# Patient Record
Sex: Female | Born: 1955 | Race: White | Hispanic: No | Marital: Married | Smoking: Current every day smoker
Health system: Southern US, Community
[De-identification: ages and names within clinical notes are randomized; demographics above are authoritative.]

## PROBLEM LIST (undated history)

## (undated) DIAGNOSIS — C240 Malignant neoplasm of extrahepatic bile duct: Secondary | ICD-10-CM

## (undated) HISTORY — PX: OTHER SURGICAL HISTORY: SHX169

---

## 1997-06-07 ENCOUNTER — Encounter: Admission: RE | Admit: 1997-06-07 | Discharge: 1997-09-05 | Payer: Self-pay | Admitting: Radiation Oncology

## 1997-08-03 ENCOUNTER — Other Ambulatory Visit: Admission: RE | Admit: 1997-08-03 | Discharge: 1997-08-03 | Payer: Self-pay | Admitting: Oncology

## 1997-10-13 ENCOUNTER — Emergency Department (HOSPITAL_COMMUNITY): Admission: EM | Admit: 1997-10-13 | Discharge: 1997-10-13 | Payer: Self-pay | Admitting: Emergency Medicine

## 1998-12-04 ENCOUNTER — Other Ambulatory Visit: Admission: RE | Admit: 1998-12-04 | Discharge: 1998-12-04 | Payer: Self-pay | Admitting: Obstetrics and Gynecology

## 1998-12-05 ENCOUNTER — Encounter (INDEPENDENT_AMBULATORY_CARE_PROVIDER_SITE_OTHER): Payer: Self-pay

## 1998-12-05 ENCOUNTER — Other Ambulatory Visit: Admission: RE | Admit: 1998-12-05 | Discharge: 1998-12-05 | Payer: Self-pay | Admitting: Obstetrics and Gynecology

## 1998-12-06 ENCOUNTER — Emergency Department (HOSPITAL_COMMUNITY): Admission: EM | Admit: 1998-12-06 | Discharge: 1998-12-06 | Payer: Self-pay | Admitting: Emergency Medicine

## 1999-02-24 ENCOUNTER — Inpatient Hospital Stay (HOSPITAL_COMMUNITY): Admission: RE | Admit: 1999-02-24 | Discharge: 1999-02-26 | Payer: Self-pay | Admitting: Obstetrics and Gynecology

## 1999-02-24 ENCOUNTER — Encounter (INDEPENDENT_AMBULATORY_CARE_PROVIDER_SITE_OTHER): Payer: Self-pay | Admitting: Specialist

## 1999-04-24 ENCOUNTER — Ambulatory Visit (HOSPITAL_BASED_OUTPATIENT_CLINIC_OR_DEPARTMENT_OTHER): Admission: RE | Admit: 1999-04-24 | Discharge: 1999-04-24 | Payer: Self-pay | Admitting: General Surgery

## 1999-05-27 ENCOUNTER — Encounter: Admission: RE | Admit: 1999-05-27 | Discharge: 1999-05-27 | Payer: Self-pay | Admitting: Hematology & Oncology

## 1999-05-27 ENCOUNTER — Encounter: Payer: Self-pay | Admitting: Hematology & Oncology

## 2000-01-05 ENCOUNTER — Encounter: Admission: RE | Admit: 2000-01-05 | Discharge: 2000-01-05 | Payer: Self-pay | Admitting: Hematology & Oncology

## 2000-01-05 ENCOUNTER — Encounter: Payer: Self-pay | Admitting: Hematology & Oncology

## 2000-02-05 ENCOUNTER — Encounter: Payer: Self-pay | Admitting: Emergency Medicine

## 2000-02-05 ENCOUNTER — Emergency Department (HOSPITAL_COMMUNITY): Admission: EM | Admit: 2000-02-05 | Discharge: 2000-02-05 | Payer: Self-pay | Admitting: Emergency Medicine

## 2001-01-11 ENCOUNTER — Encounter: Admission: RE | Admit: 2001-01-11 | Discharge: 2001-01-11 | Payer: Self-pay | Admitting: Internal Medicine

## 2002-07-31 ENCOUNTER — Encounter: Admission: RE | Admit: 2002-07-31 | Discharge: 2002-07-31 | Payer: Self-pay | Admitting: Internal Medicine

## 2002-08-09 ENCOUNTER — Encounter: Admission: RE | Admit: 2002-08-09 | Discharge: 2002-08-09 | Payer: Self-pay | Admitting: Internal Medicine

## 2002-08-09 ENCOUNTER — Encounter: Payer: Self-pay | Admitting: Internal Medicine

## 2004-07-14 ENCOUNTER — Ambulatory Visit: Payer: Self-pay | Admitting: Internal Medicine

## 2005-04-16 ENCOUNTER — Ambulatory Visit: Payer: Self-pay | Admitting: Internal Medicine

## 2006-05-31 ENCOUNTER — Encounter (INDEPENDENT_AMBULATORY_CARE_PROVIDER_SITE_OTHER): Payer: Self-pay | Admitting: Internal Medicine

## 2006-05-31 ENCOUNTER — Ambulatory Visit: Payer: Self-pay | Admitting: Internal Medicine

## 2006-05-31 DIAGNOSIS — Z853 Personal history of malignant neoplasm of breast: Secondary | ICD-10-CM

## 2006-05-31 DIAGNOSIS — F17200 Nicotine dependence, unspecified, uncomplicated: Secondary | ICD-10-CM

## 2006-05-31 DIAGNOSIS — Z9079 Acquired absence of other genital organ(s): Secondary | ICD-10-CM | POA: Insufficient documentation

## 2006-05-31 DIAGNOSIS — R569 Unspecified convulsions: Secondary | ICD-10-CM

## 2006-05-31 DIAGNOSIS — N951 Menopausal and female climacteric states: Secondary | ICD-10-CM

## 2009-09-13 ENCOUNTER — Emergency Department (HOSPITAL_COMMUNITY): Admission: EM | Admit: 2009-09-13 | Discharge: 2009-09-14 | Payer: Self-pay | Admitting: Emergency Medicine

## 2010-04-08 ENCOUNTER — Other Ambulatory Visit: Payer: Self-pay | Admitting: Family Medicine

## 2010-04-08 DIAGNOSIS — Z1239 Encounter for other screening for malignant neoplasm of breast: Secondary | ICD-10-CM

## 2010-04-14 ENCOUNTER — Ambulatory Visit
Admission: RE | Admit: 2010-04-14 | Discharge: 2010-04-14 | Disposition: A | Discharge status: Home / Self Care | Payer: Self-pay | Source: Ambulatory Visit | Attending: Family Medicine | Admitting: Family Medicine

## 2010-04-14 DIAGNOSIS — Z1239 Encounter for other screening for malignant neoplasm of breast: Secondary | ICD-10-CM

## 2010-08-06 ENCOUNTER — Other Ambulatory Visit: Payer: Self-pay | Admitting: Family Medicine

## 2010-08-06 DIAGNOSIS — Z13820 Encounter for screening for osteoporosis: Secondary | ICD-10-CM

## 2010-08-14 ENCOUNTER — Ambulatory Visit
Admission: RE | Admit: 2010-08-14 | Discharge: 2010-08-14 | Disposition: A | Discharge status: Home / Self Care | Payer: BC Managed Care – PPO | Source: Ambulatory Visit | Attending: Family Medicine | Admitting: Family Medicine

## 2010-08-14 DIAGNOSIS — Z13820 Encounter for screening for osteoporosis: Secondary | ICD-10-CM

## 2011-12-04 ENCOUNTER — Other Ambulatory Visit: Payer: Self-pay | Admitting: Family Medicine

## 2011-12-04 DIAGNOSIS — Z1231 Encounter for screening mammogram for malignant neoplasm of breast: Secondary | ICD-10-CM

## 2011-12-24 ENCOUNTER — Ambulatory Visit: Payer: BC Managed Care – PPO

## 2012-01-01 ENCOUNTER — Ambulatory Visit
Admission: RE | Admit: 2012-01-01 | Discharge: 2012-01-01 | Disposition: A | Discharge status: Home / Self Care | Payer: Medicaid Other | Source: Ambulatory Visit | Attending: Family Medicine | Admitting: Family Medicine

## 2012-01-01 DIAGNOSIS — Z1231 Encounter for screening mammogram for malignant neoplasm of breast: Secondary | ICD-10-CM

## 2012-01-05 ENCOUNTER — Other Ambulatory Visit: Payer: Self-pay | Admitting: Family Medicine

## 2012-01-05 DIAGNOSIS — R928 Other abnormal and inconclusive findings on diagnostic imaging of breast: Secondary | ICD-10-CM

## 2012-01-11 ENCOUNTER — Ambulatory Visit
Admission: RE | Admit: 2012-01-11 | Discharge: 2012-01-11 | Disposition: A | Discharge status: Home / Self Care | Payer: Medicaid Other | Source: Ambulatory Visit | Attending: Family Medicine | Admitting: Family Medicine

## 2012-01-11 DIAGNOSIS — R928 Other abnormal and inconclusive findings on diagnostic imaging of breast: Secondary | ICD-10-CM

## 2014-10-29 ENCOUNTER — Emergency Department (HOSPITAL_COMMUNITY): Payer: Medicare Other

## 2014-10-29 ENCOUNTER — Emergency Department (HOSPITAL_COMMUNITY)
Admission: EM | Admit: 2014-10-29 | Discharge: 2014-10-30 | Disposition: A | Discharge status: Home / Self Care | Payer: Medicare Other | Attending: Emergency Medicine | Admitting: Emergency Medicine

## 2014-10-29 ENCOUNTER — Encounter (HOSPITAL_COMMUNITY): Payer: Self-pay | Admitting: Emergency Medicine

## 2014-10-29 DIAGNOSIS — Y9289 Other specified places as the place of occurrence of the external cause: Secondary | ICD-10-CM | POA: Insufficient documentation

## 2014-10-29 DIAGNOSIS — W108XXA Fall (on) (from) other stairs and steps, initial encounter: Secondary | ICD-10-CM | POA: Insufficient documentation

## 2014-10-29 DIAGNOSIS — Y9301 Activity, walking, marching and hiking: Secondary | ICD-10-CM | POA: Diagnosis not present

## 2014-10-29 DIAGNOSIS — S0093XA Contusion of unspecified part of head, initial encounter: Secondary | ICD-10-CM | POA: Diagnosis not present

## 2014-10-29 DIAGNOSIS — F10120 Alcohol abuse with intoxication, uncomplicated: Secondary | ICD-10-CM | POA: Insufficient documentation

## 2014-10-29 DIAGNOSIS — Z720 Tobacco use: Secondary | ICD-10-CM | POA: Insufficient documentation

## 2014-10-29 DIAGNOSIS — S0990XA Unspecified injury of head, initial encounter: Secondary | ICD-10-CM

## 2014-10-29 DIAGNOSIS — F10920 Alcohol use, unspecified with intoxication, uncomplicated: Secondary | ICD-10-CM

## 2014-10-29 DIAGNOSIS — Y998 Other external cause status: Secondary | ICD-10-CM | POA: Diagnosis not present

## 2014-10-29 HISTORY — DX: Malignant neoplasm of extrahepatic bile duct: C24.0

## 2014-10-29 LAB — CBC WITH DIFFERENTIAL/PLATELET
BASOS PCT: 0 % (ref 0–1)
Basophils Absolute: 0 10*3/uL (ref 0.0–0.1)
Eosinophils Absolute: 0.1 10*3/uL (ref 0.0–0.7)
Eosinophils Relative: 4 % (ref 0–5)
HEMATOCRIT: 35.2 % — AB (ref 36.0–46.0)
HEMOGLOBIN: 11.6 g/dL — AB (ref 12.0–15.0)
LYMPHS ABS: 1.1 10*3/uL (ref 0.7–4.0)
LYMPHS PCT: 43 % (ref 12–46)
MCH: 33.6 pg (ref 26.0–34.0)
MCHC: 33 g/dL (ref 30.0–36.0)
MCV: 102 fL — AB (ref 78.0–100.0)
MONO ABS: 0.2 10*3/uL (ref 0.1–1.0)
MONOS PCT: 8 % (ref 3–12)
NEUTROS ABS: 1.2 10*3/uL — AB (ref 1.7–7.7)
NEUTROS PCT: 45 % (ref 43–77)
Platelets: 99 10*3/uL — ABNORMAL LOW (ref 150–400)
RBC: 3.45 MIL/uL — ABNORMAL LOW (ref 3.87–5.11)
RDW: 13.4 % (ref 11.5–15.5)
WBC: 2.6 10*3/uL — ABNORMAL LOW (ref 4.0–10.5)

## 2014-10-29 LAB — COMPREHENSIVE METABOLIC PANEL
ALK PHOS: 108 U/L (ref 38–126)
ALT: 20 U/L (ref 14–54)
AST: 34 U/L (ref 15–41)
Albumin: 3.8 g/dL (ref 3.5–5.0)
Anion gap: 12 (ref 5–15)
BUN: 13 mg/dL (ref 6–20)
CALCIUM: 9.1 mg/dL (ref 8.9–10.3)
CHLORIDE: 107 mmol/L (ref 101–111)
CO2: 23 mmol/L (ref 22–32)
CREATININE: 0.97 mg/dL (ref 0.44–1.00)
GFR calc Af Amer: >60 mL/min (ref >60)
Glucose, Bld: 109 mg/dL — ABNORMAL HIGH (ref 65–99)
Potassium: 3.3 mmol/L — ABNORMAL LOW (ref 3.5–5.1)
Sodium: 142 mmol/L (ref 135–145)
Total Bilirubin: 0.4 mg/dL (ref 0.3–1.2)
Total Protein: 6.1 g/dL — ABNORMAL LOW (ref 6.5–8.1)

## 2014-10-29 LAB — AMMONIA: AMMONIA: 28 umol/L (ref 9–35)

## 2014-10-29 LAB — PROTIME-INR
INR: 2.6 — AB (ref 0.00–1.49)
Prothrombin Time: 27.5 seconds — ABNORMAL HIGH (ref 11.6–15.2)

## 2014-10-29 NOTE — ED Provider Notes (Signed)
CSN: [id number]     Arrival date & time 10/29/14  2105 History   First MD Initiated Contact with Patient 10/29/14 2106     Chief Complaint  Patient presents with  . Fall  . Alcohol Intoxication     (Consider location/radiation/quality/duration/timing/severity/associated sxs/prior Treatment) HPI Comments: Patient presents to the emergency department for evaluation after a fall. Patient admits to drinking alcohol tonight. Patient reportedly lost her balance walking up steps and fell backwards. She fell down several steps and landed on the concrete landing at the bottom of the steps. There was no loss of consciousness according to bystanders. Patient noted to have a hematoma on the back of her head by EMS, patient denies pain.     Patient is a 59 y.o. female presenting with fall and intoxication.  Fall Associated symptoms include headaches.  Alcohol Intoxication Associated symptoms include headaches.    Past Medical History  Diagnosis Date  . Bile duct cancer    Past Surgical History  Procedure Laterality Date  . Bile duct cancer     No family history on file. Social History  Substance Use Topics  . Smoking status: Current Every Day Smoker  . Smokeless tobacco: None  . Alcohol Use: Yes   OB History    No data available     Review of Systems  Neurological: Positive for headaches.  All other systems reviewed and are negative.     Allergies  Review of patient's allergies indicates not on file.  Home Medications   Prior to Admission medications   Not on File   SpO2 100% Physical Exam  Constitutional: She is oriented to person, place, and time. She appears well-developed and well-nourished. No distress.  HENT:  Head: Normocephalic. Head is with contusion.    Right Ear: Hearing normal.  Left Ear: Hearing normal.  Nose: Nose normal.  Mouth/Throat: Oropharynx is clear and moist and mucous membranes are normal.  Eyes: Conjunctivae and EOM are normal. Pupils are  equal, round, and reactive to light.  Neck: Normal range of motion. Neck supple.  Cardiovascular: Regular rhythm, S1 normal and S2 normal.  Exam reveals no gallop and no friction rub.   No murmur heard. Pulmonary/Chest: Effort normal and breath sounds normal. No respiratory distress. She exhibits no tenderness.  Abdominal: Soft. Normal appearance and bowel sounds are normal. There is no hepatosplenomegaly. There is no tenderness. There is no rebound, no guarding, no tenderness at McBurney's point and negative Murphy's sign. No hernia.  Musculoskeletal: Normal range of motion.  Neurological: She is alert and oriented to person, place, and time. She has normal strength. No cranial nerve deficit or sensory deficit. Coordination normal. GCS eye subscore is 4. GCS verbal subscore is 5. GCS motor subscore is 6.  Skin: Skin is warm, dry and intact. No rash noted. No cyanosis.  Psychiatric: Thought content normal. Her speech is slurred.  Nursing note and vitals reviewed.   ED Course  Procedures (including critical care time) Labs Review Labs Reviewed  CBC WITH DIFFERENTIAL/PLATELET  PROTIME-INR  COMPREHENSIVE METABOLIC PANEL  AMMONIA    Imaging Review No results found. I have personally reviewed and evaluated these images and lab results as part of my medical decision-making.   EKG Interpretation None      MDM   Final diagnoses:  None  head contusion Alcohol Intoxication  Patient presents to the emergency department after a fall. Patient was walking up a flight of steps and lost her balance, falling backwards. She hit the  back of her head on the concrete at the bottom of the steps. There was no loss of consciousness. Patient has been drinking alcohol. Is somewhat intoxicated at arrival, although she is awake, alert, oriented. He does report that she was asking repetitive questions initially, but I have not seen any of his repetitive questioning. I suspect it was the alcohol  intoxication, but minor head injury cannot be ruled out. CT scan did not show any acute injury. Patient has been monitored and continues to do well. She is appropriate for discharge, outpatient treatment.    Orpah Greek, MD 10/30/14 [phone]

## 2014-10-29 NOTE — ED Notes (Signed)
Per [facility]. Pt drinking ETOH, confused about what day it was and location. PERRLA. GCS 13, pt asking repetitive questions. Pt denies pain. Pt on C collar and LSB. Bystanders and patient denies LOC. Pt has hematoma on back of head. Pt is asking for water repeatedly with slurred speech. VSS

## 2014-10-29 NOTE — ED Notes (Signed)
Pt fell while walking up stairs, tripped and fell backwards at the bottom of the steps.

## 2014-10-29 NOTE — ED Notes (Signed)
Pt removed c-collar. States "i took it off, I can't breathe with that thing on."

## 2014-10-30 MED ORDER — HYDROCODONE-ACETAMINOPHEN 5-325 MG PO TABS: 2.0000 | ORAL_TABLET | ORAL | Status: AC | PRN

## 2014-10-30 NOTE — Discharge Instructions (Signed)
Alcohol Intoxication Alcohol intoxication occurs when you drink enough alcohol that it affects your ability to function. It can be mild or very severe. Drinking a lot of alcohol in a short time is called binge drinking. This can be very harmful. Drinking alcohol can also be more dangerous if you are taking medicines or other drugs. Some of the effects caused by alcohol may include:  Loss of coordination.  Changes in mood and behavior.  Unclear thinking.  Trouble talking (slurred speech).  Throwing up (vomiting).  Confusion.  Slowed breathing.  Twitching and shaking (seizures).  Loss of consciousness. HOME CARE  Do not drive after drinking alcohol.  Drink enough water and fluids to keep your pee (urine) clear or pale yellow. Avoid caffeine.  Only take medicine as told by your doctor. GET HELP IF:  You throw up (vomit) many times.  You do not feel better after a few days.  You frequently have alcohol intoxication. Your doctor can help decide if you should see a substance use treatment counselor. GET HELP RIGHT AWAY IF:  You become shaky when you stop drinking.  You have twitching and shaking.  You throw up blood. It may look bright red or like coffee grounds.  You notice blood in your poop (bowel movements).  You become lightheaded or pass out (faint). MAKE SURE YOU:   Understand these instructions.  Will watch your condition.  Will get help right away if you are not doing well or get worse. Document Released: 08/12/2007 Document Revised: 10/26/2012 Document Reviewed: 07/29/2012 [facility] Patient Information 2015 [place], [place]. This information is not intended to replace advice given to you by your health care provider. Make sure you discuss any questions you have with your health care provider.  Concussion A concussion is a brain injury. It is caused by:  A hit to the head.  A quick and sudden movement (jolt) of the head or neck. A concussion is usually  not life threatening. Even so, it can cause serious problems. If you had a concussion before, you may have concussion-like problems after a hit to your head. HOME CARE General Instructions  Follow your doctor's directions carefully.  Take medicines only as told by your doctor.  Only take medicines your doctor says are safe.  Do not drink alcohol until your doctor says it is okay. Alcohol and some drugs can slow down healing. They can also put you at risk for further injury.  If you are having trouble remembering things, write them down.  Try to do one thing at a time if you get distracted easily. For example, do not watch TV while making dinner.  Talk to your family members or close friends when making important decisions.  Follow up with your doctor as told.  Watch your symptoms. Tell others to do the same. Serious problems can sometimes happen after a concussion. Older adults are more likely to have these problems.  Tell your teachers, school nurse, school counselor, coach, Product/process development scientist, or work Freight forwarder about your concussion. Tell them about what you can or cannot do. They should watch to see if:  It gets even harder for you to pay attention or concentrate.  It gets even harder for you to remember things or learn new things.  You need more time than normal to finish things.  You become annoyed (irritable) more than before.  You are not able to deal with stress as well.  You have more problems than before.  Rest. Make sure you:  Get plenty of sleep at night.  Go to sleep early.  Go to bed at the same time every day. Try to wake up at the same time.  Rest during the day.  Take naps when you feel tired.  Limit activities where you have to think a lot or concentrate. These include:  Doing homework.  Doing work related to a job.  Watching TV.  Using the computer. Returning To Your Regular Activities Return to your normal activities slowly, not all at once.  You must give your body and brain enough time to heal.   Do not play sports or do other athletic activities until your doctor says it is okay.  Ask your doctor when you can drive, ride a bicycle, or work other vehicles or machines. Never do these things if you feel dizzy.  Ask your doctor about when you can return to work or school. Preventing Another Concussion It is very important to avoid another brain injury, especially before you have healed. In rare cases, another injury can lead to permanent brain damage, brain swelling, or death. The risk of this is greatest during the first 7-10 days after your injury. Avoid injuries by:   Wearing a seat belt when riding in a car.  Not drinking too much alcohol.  Avoiding activities that could lead to a second concussion (such as contact sports).  Wearing a helmet when doing activities like:  Biking.  Skiing.  Skateboarding.  Skating.  Making your home safer by:  Removing things from the floor or stairways that could make you trip.  Using grab bars in bathrooms and handrails by stairs.  Placing non-slip mats on floors and in bathtubs.  Improve lighting in dark areas. GET HELP IF:  It gets even harder for you to pay attention or concentrate.  It gets even harder for you to remember things or learn new things.  You need more time than normal to finish things.  You become annoyed (irritable) more than before.  You are not able to deal with stress as well.  You have more problems than before.  You have problems keeping your balance.  You are not able to react quickly when you should. Get help if you have any of these problems for more than 2 weeks:   Lasting (chronic) headaches.  Dizziness or trouble balancing.  Feeling sick to your stomach (nausea).  Seeing (vision) problems.  Being affected by noises or light more than normal.  Feeling sad, low, down in the dumps, blue, gloomy, or empty (depressed).  Mood  changes (mood swings).  Feeling of fear or nervousness about what may happen (anxiety).  Feeling annoyed.  Memory problems.  Problems concentrating or paying attention.  Sleep problems.  Feeling tired all the time. GET HELP RIGHT AWAY IF:   You have bad headaches or your headaches get worse.  You have weakness (even if it is in one hand, leg, or part of the face).  You have loss of feeling (numbness).  You feel off balance.  You keep throwing up (vomiting).  You feel tired.  One black center of your eye (pupil) is larger than the other.  You twitch or shake violently (convulse).  Your speech is not clear (slurred).  You are more confused, easily angered (agitated), or annoyed than before.  You have more trouble resting than before.  You are unable to recognize people or places.  You have neck pain.  It is difficult to wake you up.  You  have unusual behavior changes.  You pass out (lose consciousness). MAKE SURE YOU:   Understand these instructions.  Will watch your condition.  Will get help right away if you are not doing well or get worse. Document Released: 02/11/2009 Document Revised: 07/10/2013 Document Reviewed: 09/15/2012 [facility] Patient Information 2015 [place], [place]. This information is not intended to replace advice given to you by your health care provider. Make sure you discuss any questions you have with your health care provider.

## 2014-12-31 ENCOUNTER — Other Ambulatory Visit: Payer: Self-pay | Admitting: Nurse Practitioner

## 2014-12-31 DIAGNOSIS — Z1231 Encounter for screening mammogram for malignant neoplasm of breast: Secondary | ICD-10-CM

## 2015-01-02 ENCOUNTER — Other Ambulatory Visit: Payer: Self-pay

## 2015-01-02 DIAGNOSIS — Z1231 Encounter for screening mammogram for malignant neoplasm of breast: Secondary | ICD-10-CM

## 2015-02-06 ENCOUNTER — Ambulatory Visit: Payer: Medicare Other

## 2015-12-02 ENCOUNTER — Other Ambulatory Visit: Payer: Self-pay | Admitting: Nurse Practitioner

## 2015-12-02 DIAGNOSIS — M810 Age-related osteoporosis without current pathological fracture: Secondary | ICD-10-CM

## 2015-12-27 ENCOUNTER — Ambulatory Visit
Admission: RE | Admit: 2015-12-27 | Discharge: 2015-12-27 | Disposition: A | Discharge status: Home / Self Care | Payer: Medicare Other | Source: Ambulatory Visit | Attending: Nurse Practitioner | Admitting: Nurse Practitioner

## 2015-12-27 ENCOUNTER — Ambulatory Visit
Admission: RE | Admit: 2015-12-27 | Discharge: 2015-12-27 | Disposition: A | Discharge status: Home / Self Care | Payer: Medicare Other | Source: Ambulatory Visit

## 2015-12-27 DIAGNOSIS — M810 Age-related osteoporosis without current pathological fracture: Secondary | ICD-10-CM

## 2015-12-27 DIAGNOSIS — Z1231 Encounter for screening mammogram for malignant neoplasm of breast: Secondary | ICD-10-CM

## 2016-06-07 DEATH — deceased

## 2016-09-02 IMAGING — CT CT HEAD W/O CM
3 of 5 series · 16 of 47 positions shown, 19 images · non-contrast
Comparison: 10/29/14

CLINICAL DATA: Confusion. Alcohol consumption hematoma to back of
head.

EXAM:
CT HEAD WITHOUT CONTRAST
CT CERVICAL SPINE WITHOUT CONTRAST
TECHNIQUE: Multidetector CT imaging of the head and cervical spine was
performed following the standard protocol without intravenous
contrast. Multiplanar CT image reconstructions of the cervical spine
were also generated.

[Series 7: coronals · coronal · 0.30mm/px · 3 of 64 slices shown]
[im 22/64  brain]
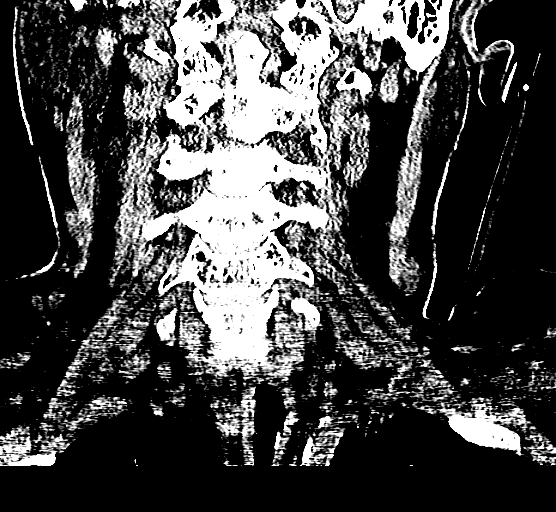
[im 29/64  brain]
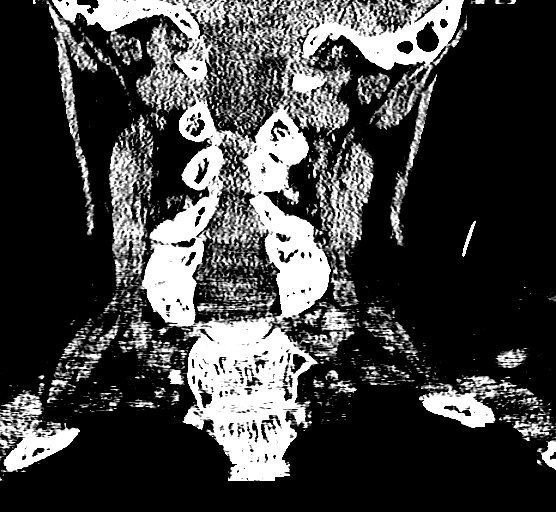
[im 36/64  brain]
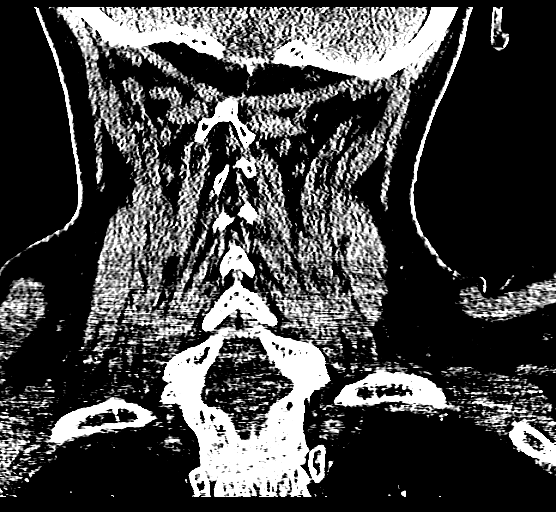

[Series 8: sagittals · sagittal · 0.30mm/px · 3 of 49 slices shown]
[im 17/49  brain]
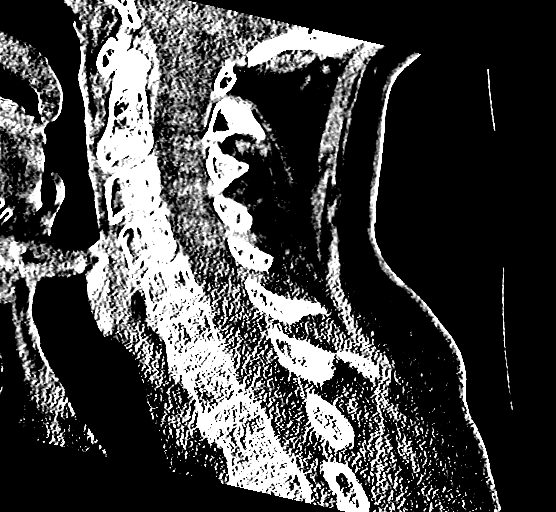
[im 25/49  brain]
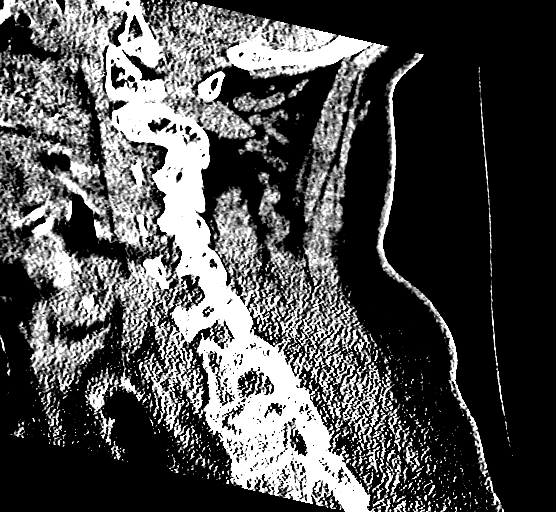
[im 33/49  brain]
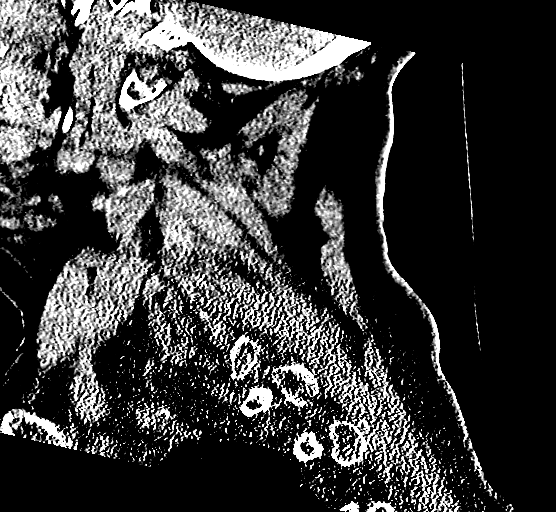

[Series 9: orthogonals · axial · 0.32mm/px · z∈[-267,-142]mm · 10 of 82 slices shown, 13 images]
[im 7/82  brain]
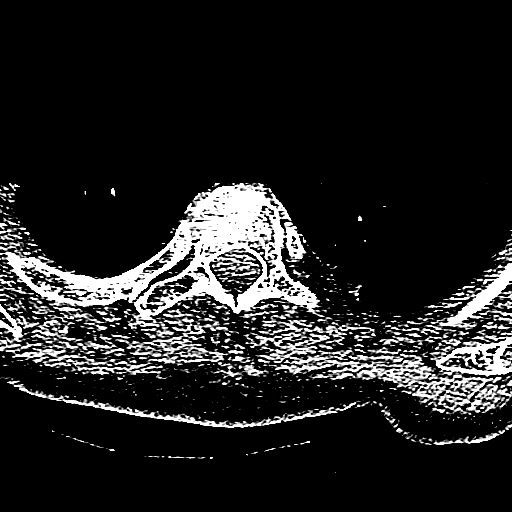
[im 7/82  bone]
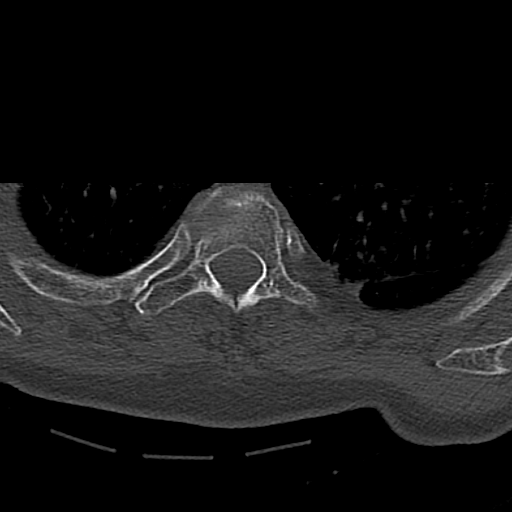
[im 13/82  brain]
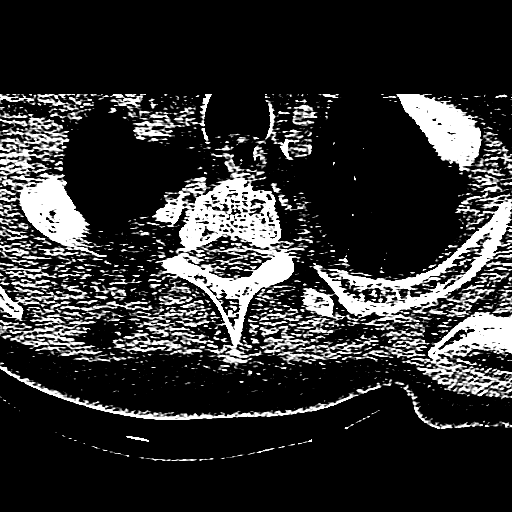
[im 25/82  brain]
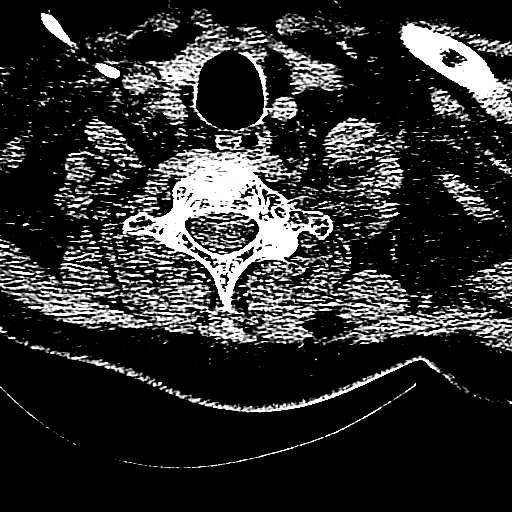
[im 32/82  brain]
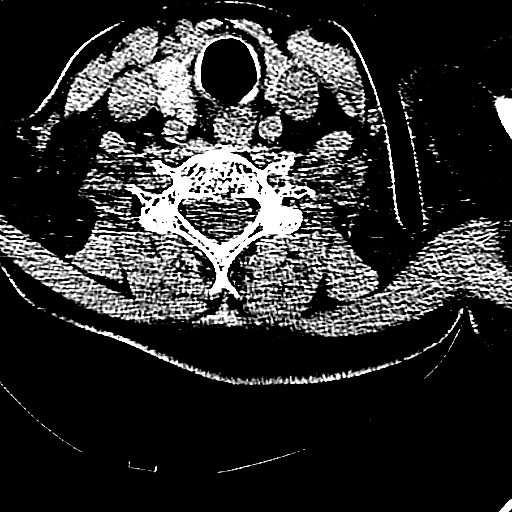
[im 38/82  brain]
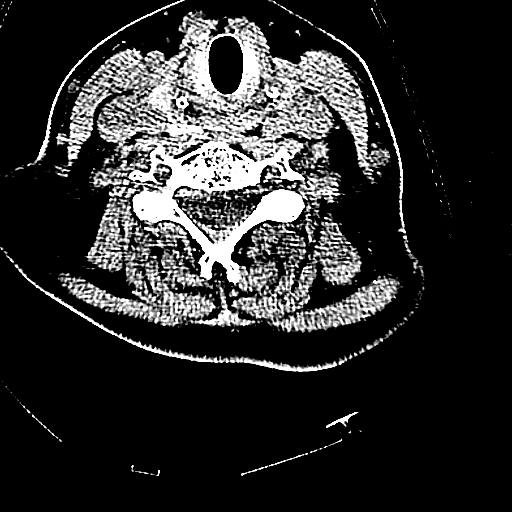
[im 38/82  bone]
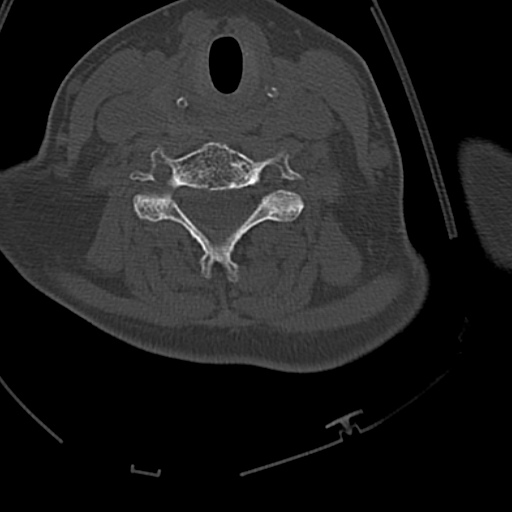
[im 44/82  brain]
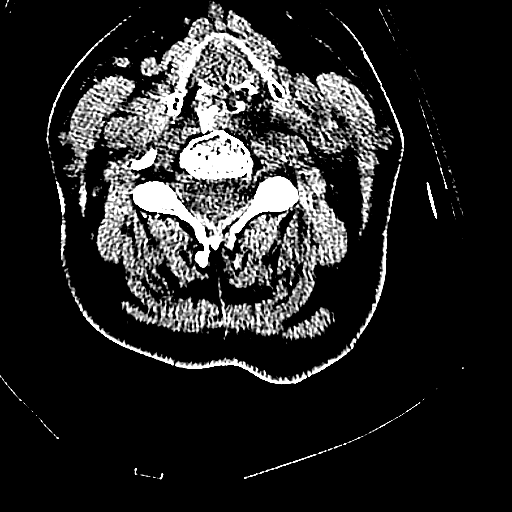
[im 50/82  brain]
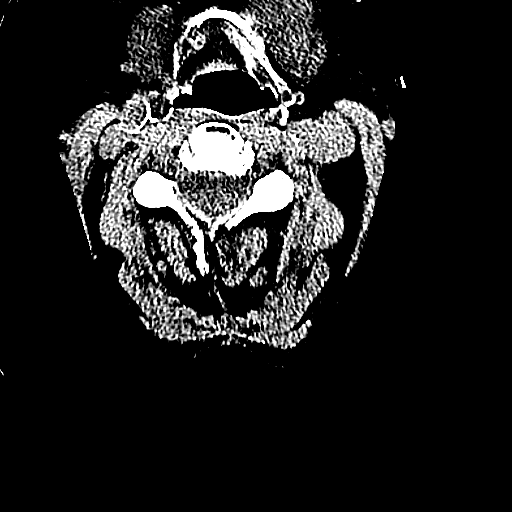
[im 63/82  brain]
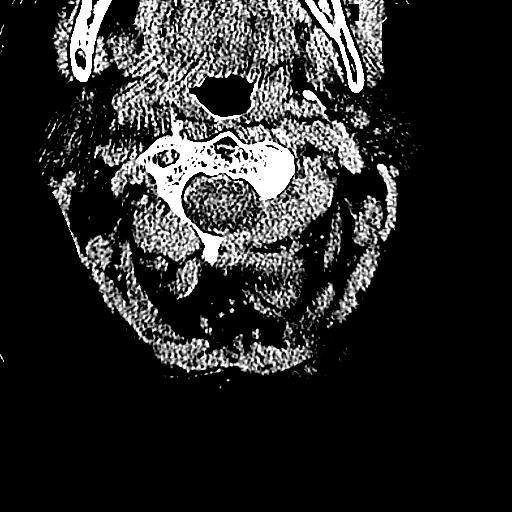
[im 69/82  brain]
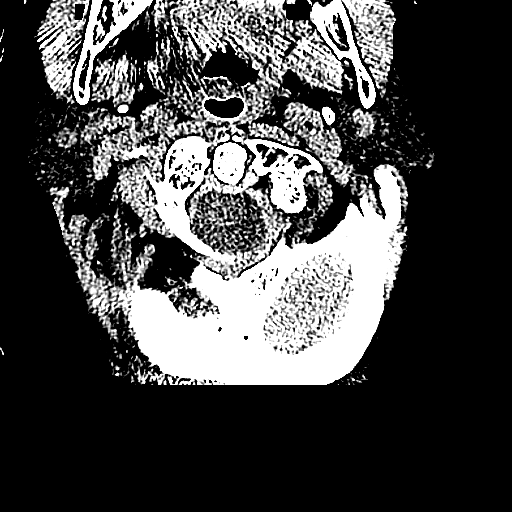
[im 69/82  bone]
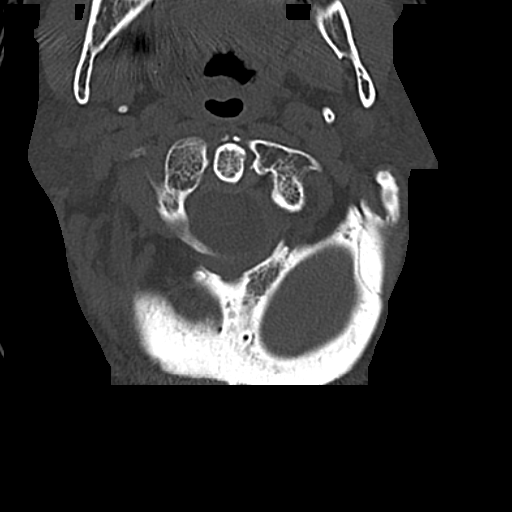
[im 75/82  brain]
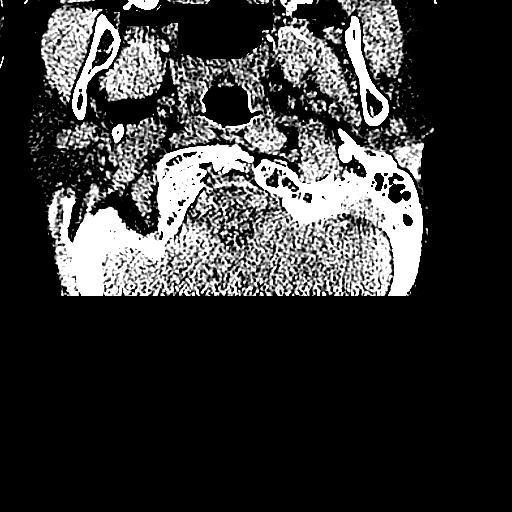

[16 of 47 positions shown; findings below may reference images not displayed]

FINDINGS: CT HEAD FINDINGS

Mild patchy low attenuation within the subcortical white matter is
identified. No acute cortical infarct, hemorrhage, or mass lesion
ispresent. Ventricles are of normal size. No significant extra-axial
fluid collection is present. The paranasal sinuses andmastoid air
cells are clear. The osseous skull is intact. Left posterior scalp
hematoma is identified, image 23/series 2.

CT CERVICAL SPINE FINDINGS

Normal alignment of the cervical spine. The facet joints appear
well-aligned. The prevertebral soft tissue space appears within
normal limits. There is no fracture or subluxation identified.
Scarring noted within the left apex.
IMPRESSION: 1. No acute intracranial abnormalities.
2. Left posterior scalp hematoma.
3. No evidence for cervical spine fracture.
